# Patient Record
Sex: Female | Born: 1945 | Race: White | Hispanic: No | Smoking: Never smoker
Health system: Southern US, Community
[De-identification: ages and names within clinical notes are randomized; demographics above are authoritative.]

## PROBLEM LIST (undated history)

## (undated) DIAGNOSIS — I4892 Unspecified atrial flutter: Secondary | ICD-10-CM

---

## 2017-06-05 ENCOUNTER — Emergency Department (HOSPITAL_BASED_OUTPATIENT_CLINIC_OR_DEPARTMENT_OTHER)
Admission: EM | Admit: 2017-06-05 | Discharge: 2017-06-05 | Disposition: A | Discharge status: Home / Self Care | Payer: Medicare Other | Attending: Emergency Medicine | Admitting: Emergency Medicine

## 2017-06-05 ENCOUNTER — Other Ambulatory Visit: Payer: Self-pay

## 2017-06-05 ENCOUNTER — Encounter (HOSPITAL_BASED_OUTPATIENT_CLINIC_OR_DEPARTMENT_OTHER): Payer: Self-pay | Admitting: Emergency Medicine

## 2017-06-05 ENCOUNTER — Emergency Department (HOSPITAL_BASED_OUTPATIENT_CLINIC_OR_DEPARTMENT_OTHER): Payer: Medicare Other

## 2017-06-05 DIAGNOSIS — Y939 Activity, unspecified: Secondary | ICD-10-CM | POA: Diagnosis not present

## 2017-06-05 DIAGNOSIS — S0990XA Unspecified injury of head, initial encounter: Secondary | ICD-10-CM | POA: Diagnosis present

## 2017-06-05 DIAGNOSIS — Y999 Unspecified external cause status: Secondary | ICD-10-CM | POA: Insufficient documentation

## 2017-06-05 DIAGNOSIS — Z7901 Long term (current) use of anticoagulants: Secondary | ICD-10-CM | POA: Diagnosis not present

## 2017-06-05 DIAGNOSIS — Z79899 Other long term (current) drug therapy: Secondary | ICD-10-CM | POA: Insufficient documentation

## 2017-06-05 DIAGNOSIS — W01198A Fall on same level from slipping, tripping and stumbling with subsequent striking against other object, initial encounter: Secondary | ICD-10-CM | POA: Diagnosis not present

## 2017-06-05 DIAGNOSIS — Y92009 Unspecified place in unspecified non-institutional (private) residence as the place of occurrence of the external cause: Secondary | ICD-10-CM | POA: Diagnosis not present

## 2017-06-05 HISTORY — DX: Unspecified atrial flutter: I48.92

## 2017-06-05 NOTE — ED Triage Notes (Signed)
Pt tripped over her dog last night causing her to fall backwards. Pt c/o pain to the back of her head. Denies LOC.

## 2017-06-05 NOTE — ED Provider Notes (Signed)
[facility] EMERGENCY DEPARTMENT Provider Note   CSN: [id number] Arrival date & time: 06/05/17  1025     History   Chief Complaint Chief Complaint  Patient presents with  . Fall    HPI Destiny Middleton is a 72 y.o. female.  72 year old female with past medical history including atrial flutter on Eliquis who presents with head injury.  Last night between 9 and 10 PM, she tripped over her dog and fell backwards, striking the back of her head on the ground.  She did not lose consciousness and has been ambulatory since then.  When she woke up this morning, she noticed a large hematoma on the back of her head that is sore to touch.  She denies any significant headache.  No neck pain, vision changes, vomiting, extremity weakness, or other significant injuries from the fall.   The history is provided by the patient.  Fall     Past Medical History:  Diagnosis Date  . Atrial flutter (HCC)     There are no active problems to display for this patient.   History reviewed. No pertinent surgical history.  OB History    No data available       Home Medications    Prior to Admission medications   Medication Sig Start Date End Date Taking? Authorizing Provider  apixaban (ELIQUIS) 5 MG TABS tablet Take 5 mg by mouth 2 (two) times daily.   Yes [provider]  losartan (COZAAR) 25 MG tablet Take 25 mg by mouth daily.   Yes [provider]  metoprolol tartrate (LOPRESSOR) 50 MG tablet Take 50 mg by mouth 2 (two) times daily.   Yes [provider]  pravastatin (PRAVACHOL) 40 MG tablet Take 40 mg by mouth daily.   Yes [provider]    Family History No family history on file.  Social History Social History   Tobacco Use  . Smoking status: Never Smoker  . Smokeless tobacco: Never Used  Substance Use Topics  . Alcohol use: Not on file  . Drug use: Not on file     Allergies   Patient has no known allergies.   Review of  Systems Review of Systems All other systems reviewed and are negative except that which was mentioned in HPI   Physical Exam Updated Vital Signs BP 136/82 (BP Location: Left Arm)   Pulse 66   Temp 98.7 F (37.1 C) (Oral)   Resp 16   Ht 5\' 7"  (1.702 m)   Wt 65.8 kg (145 lb)   SpO2 100%   BMI 22.71 kg/m   Physical Exam  Constitutional: She is oriented to person, place, and time. She appears well-developed and well-nourished. No distress.  Awake, alert  HENT:  Head: Normocephalic.  Hematoma with ecchymosis R posterior scalp  Eyes: Conjunctivae and EOM are normal. Pupils are equal, round, and reactive to light.  Neck: Neck supple.  Cardiovascular: Normal rate and normal heart sounds. An irregularly irregular rhythm present.  No murmur heard. Pulmonary/Chest: Effort normal and breath sounds normal. No respiratory distress.  Abdominal: Soft. Bowel sounds are normal. She exhibits no distension. There is no tenderness.  Musculoskeletal: She exhibits no edema.  Neurological: She is alert and oriented to person, place, and time. She has normal reflexes. No cranial nerve deficit. She exhibits normal muscle tone.  Fluent speech, normal finger-to-nose testing, negative pronator drift, no clonus 5/5 strength and normal sensation x all 4 extremities  Skin: Skin is warm and dry.  Psychiatric: She has a normal mood and affect. Judgment and thought content normal.  Nursing note and vitals reviewed.    ED Treatments / Results  Labs (all labs ordered are listed, but only abnormal results are displayed) Labs Reviewed - No data to display  EKG  EKG Interpretation None       Radiology Ct Head Wo Contrast  Result Date: 06/05/2017 CLINICAL DATA:  Patient status post fall. No reported loss of consciousness. Initial encounter. EXAM: CT HEAD WITHOUT CONTRAST TECHNIQUE: Contiguous axial images were obtained from the base of the skull through the vertex without intravenous contrast.  COMPARISON:  None. FINDINGS: Brain: Ventricles and sulci are appropriate for patient's age. No evidence for acute cortically based infarct, intracranial hemorrhage, mass lesion or mass-effect. Vascular: Atherosclerotic change. Skull: Intact.  No displaced fracture. Sinuses/Orbits: Paranasal sinuses are well aerated. Small amount of fluid in left mastoid air cells. Orbits are unremarkable. Other: There is a 3.0 x 1.6 cm soft tissue hematoma overlying the posterior right calvarium. IMPRESSION: 1. Large soft tissue hematoma overlying the posterior right calvarium. 2. No acute intracranial process. No acute displaced skull fracture. Electronically Signed   By: Annia Beltrew  Davis M.D.   On: 06/05/2017 11:14    Procedures Procedures (including critical care time)  Medications Ordered in ED Medications - No data to display   Initial Impression / Assessment and Plan / ED Course  I have reviewed the triage vital signs and the nursing notes.  Pertinent imaging results that were available during my care of the patient were reviewed by me and considered in my medical decision making (see chart for details).     Given head injury on Eliquis, recommended head CT to r/o intracranial bleed. Head CT negative for intracranial injury.  Intensively reviewed return precautions including development of new neurologic symptoms such as ataxia, vomiting, confusion, speech problems, or focal weakness.  Patient voiced understanding. Final Clinical Impressions(s) / ED Diagnoses   Final diagnoses:  Injury of head, initial encounter    ED Discharge Orders    None       Little, Ambrose Finlandachel Morgan, MD 06/05/17 1126

## 2019-02-11 IMAGING — CT CT HEAD W/O CM
3 series · 16 of 47 positions shown, 19 images · non-contrast
Comparison: None.

CLINICAL DATA: Patient status post fall. No reported loss of
consciousness. Initial encounter.

EXAM:
CT HEAD WITHOUT CONTRAST
TECHNIQUE: Contiguous axial images were obtained from the base of the skull
through the vertex without intravenous contrast.

[Series 2: head wo · axial · 0.44mm/px · z∈[-264,-124]mm · 10 of 34 slices shown, 13 images]
[im 3/34  brain]
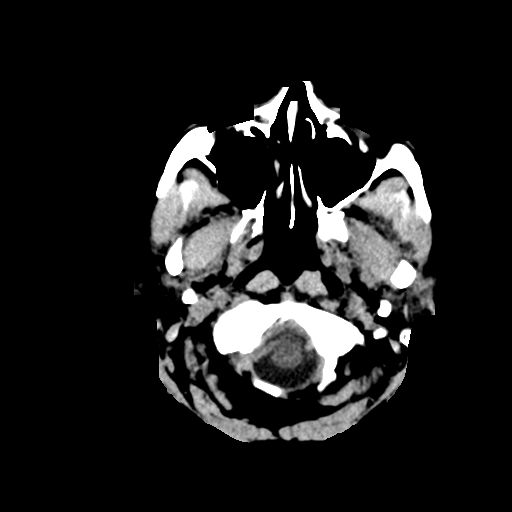
[im 3/34  bone]
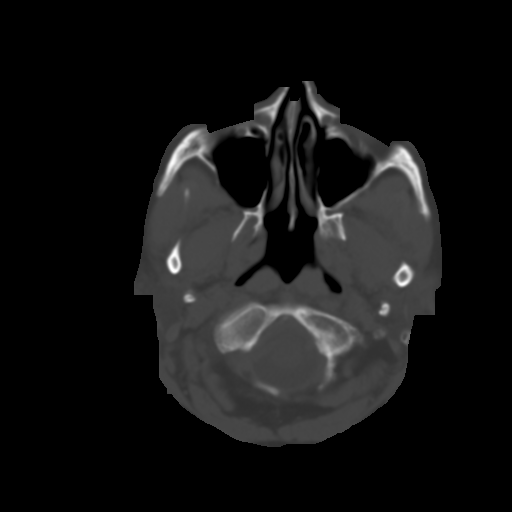
[im 6/34  brain]
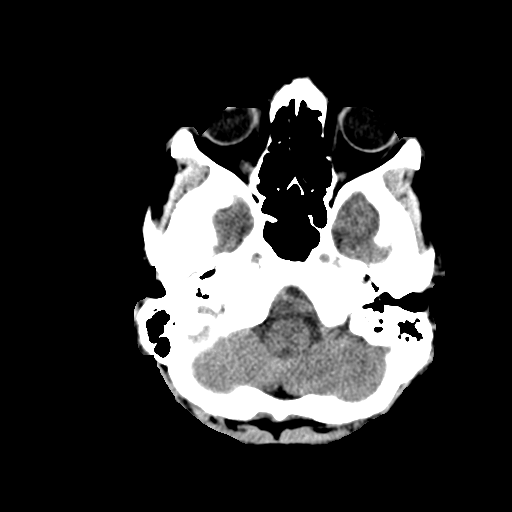
[im 10/34  brain]
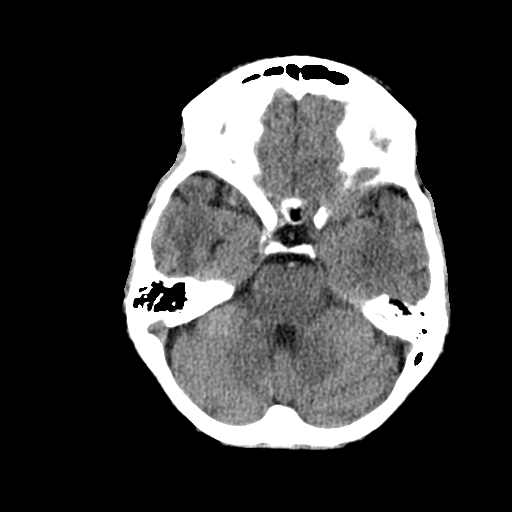
[im 12/34  brain]
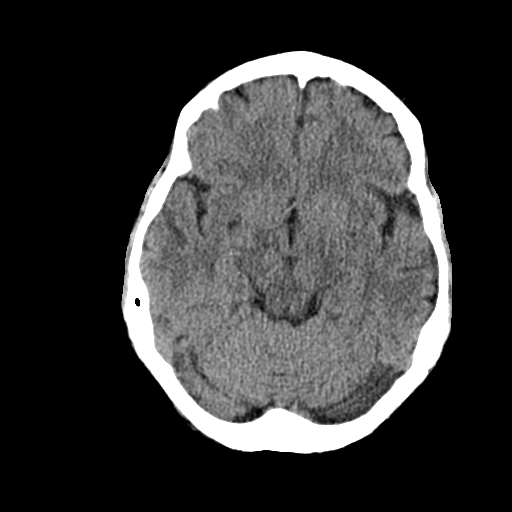
[im 15/34  brain]
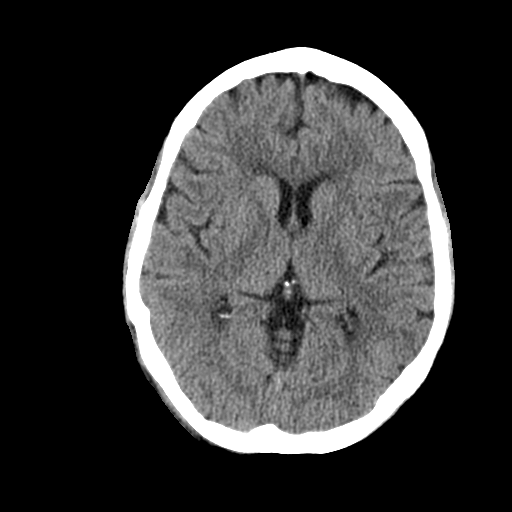
[im 15/34  bone]
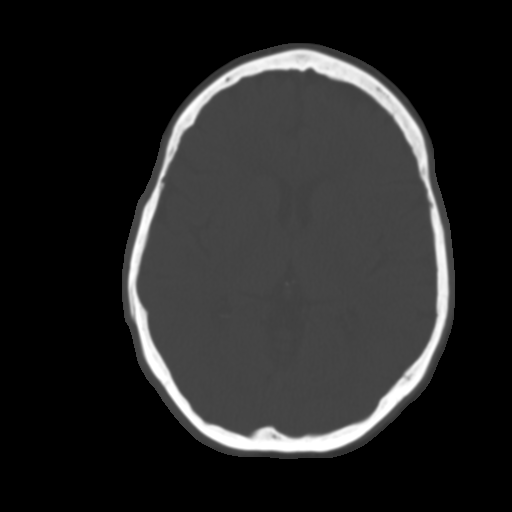
[im 19/34  brain]
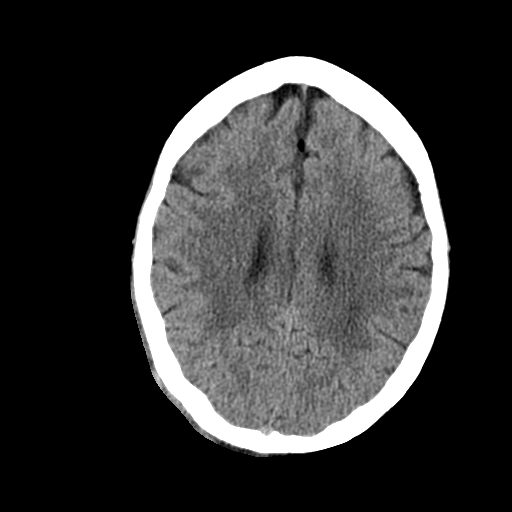
[im 22/34  brain]
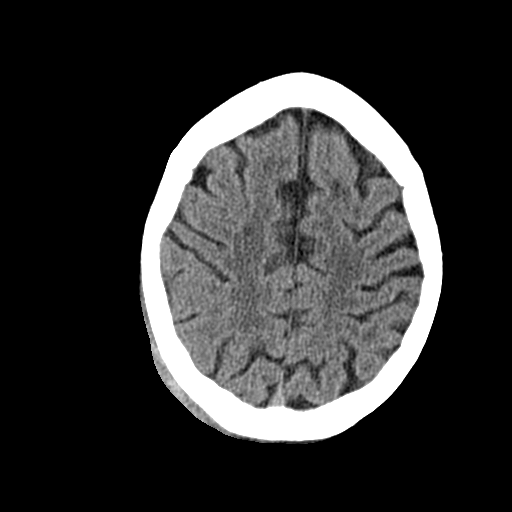
[im 26/34  brain]
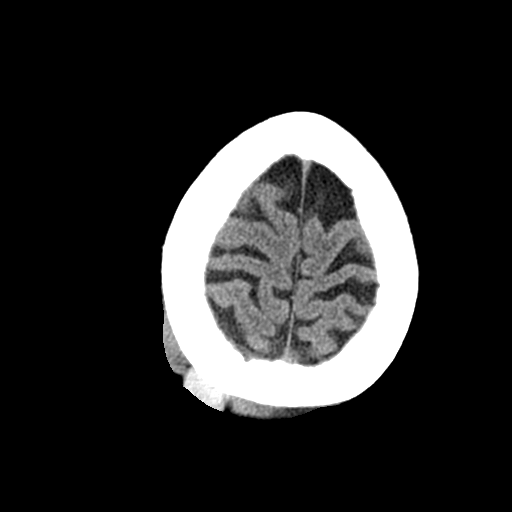
[im 28/34  brain]
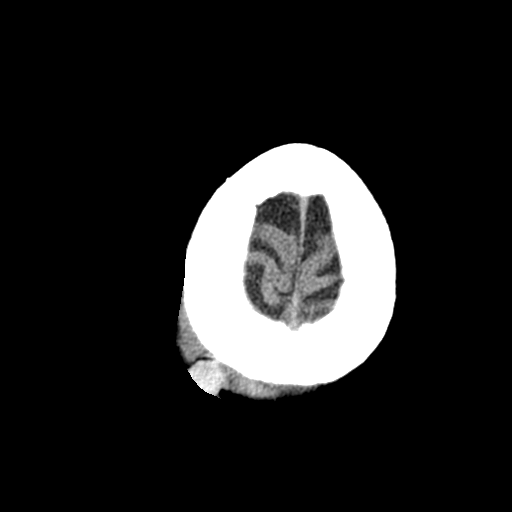
[im 28/34  bone]
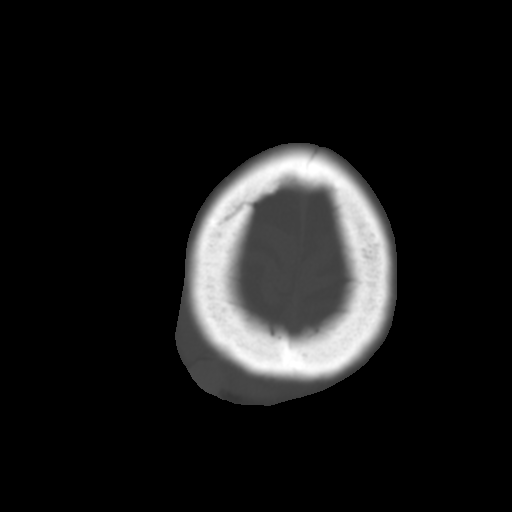
[im 31/34  brain]
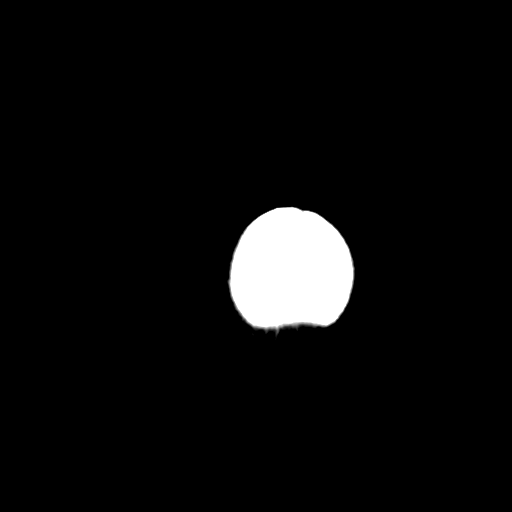

[Series 4: cor soft · coronal · 0.33mm/px · 3 of 66 slices shown]
[im 22/66  brain]
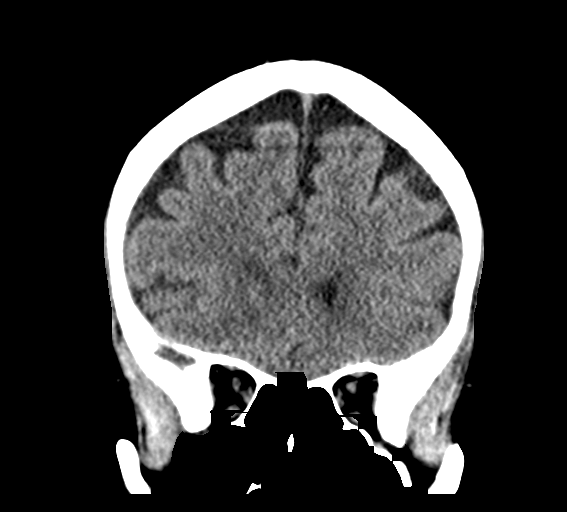
[im 29/66  brain]
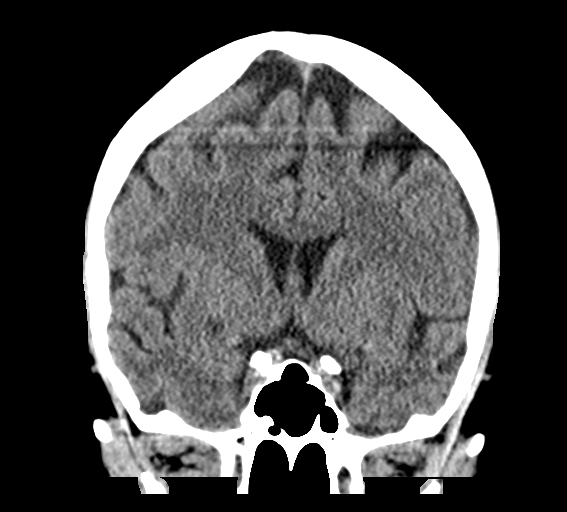
[im 37/66  brain]
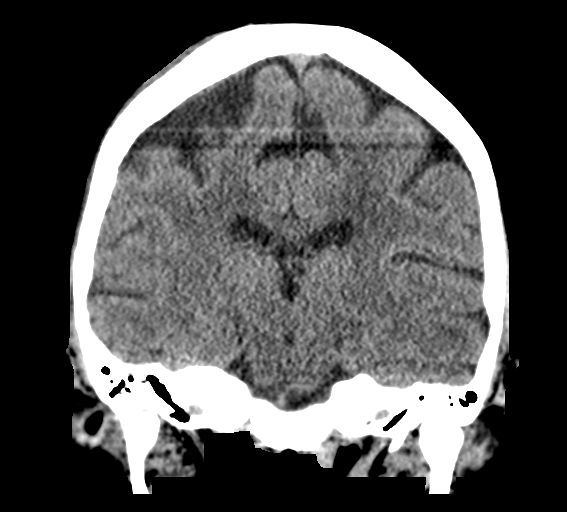

[Series 5: sag soft · sagittal · 0.33mm/px · 3 of 52 slices shown]
[im 18/52  brain]
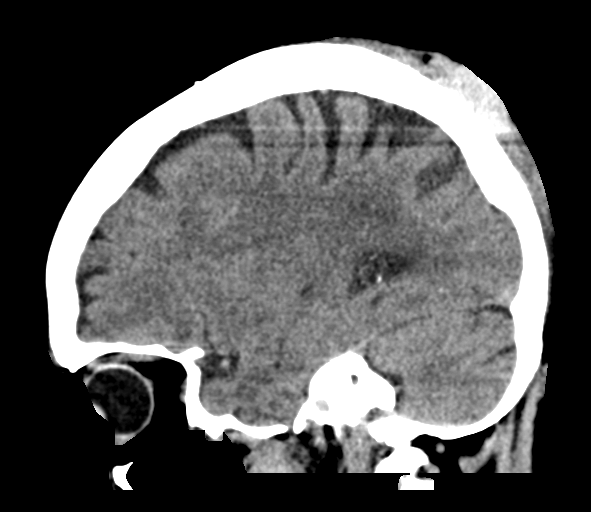
[im 26/52  brain]
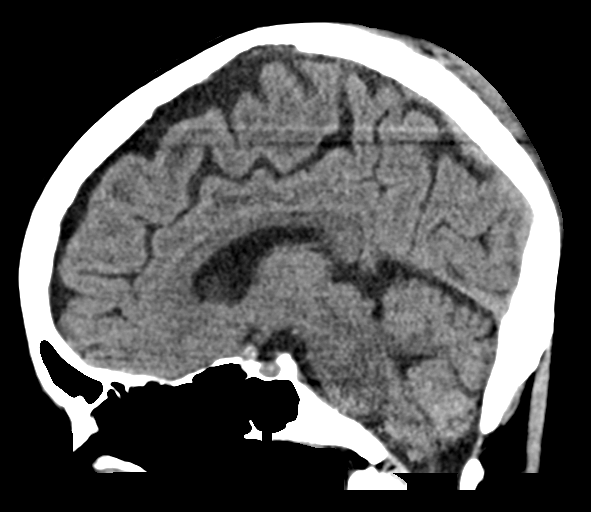
[im 35/52  brain]
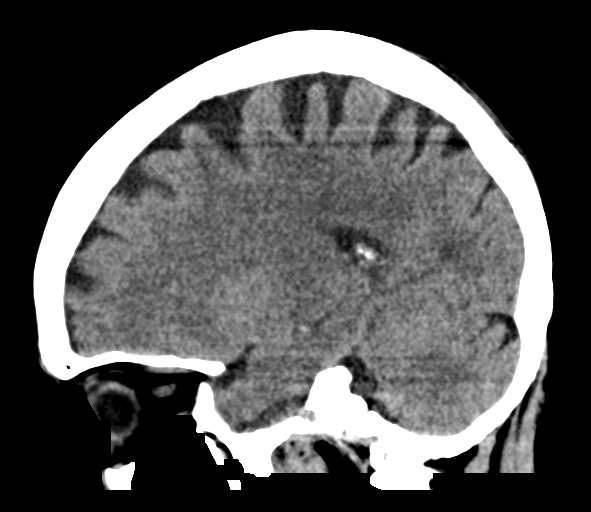

[16 of 47 positions shown; findings below may reference images not displayed]

FINDINGS: Brain: Ventricles and sulci are appropriate for patient's age. No
evidence for acute cortically based infarct, intracranial
hemorrhage, mass lesion or mass-effect.

Vascular: Atherosclerotic change.

Skull: Intact.  No displaced fracture.

Sinuses/Orbits: Paranasal sinuses are well aerated. Small amount of
fluid in left mastoid air cells. Orbits are unremarkable.

Other: There is a 3.0 x 1.6 cm soft tissue hematoma overlying the
posterior right calvarium.
IMPRESSION: 1. Large soft tissue hematoma overlying the posterior right
calvarium.
2. No acute intracranial process. No acute displaced skull fracture.
